# Patient Record
Sex: Female | Born: 1950 | Race: White | Hispanic: No | Marital: Single
Health system: Southern US, Community
[De-identification: ages and names within clinical notes are randomized; demographics above are authoritative.]

---

## 2021-03-16 ENCOUNTER — Emergency Department (HOSPITAL_COMMUNITY): Payer: Medicare Other

## 2021-03-16 ENCOUNTER — Emergency Department (HOSPITAL_COMMUNITY)
Admission: EM | Admit: 2021-03-16 | Discharge: 2021-03-16 | Discharge status: Against Medical Advice | Payer: Medicare Other | Attending: Emergency Medicine | Admitting: Emergency Medicine

## 2021-03-16 DIAGNOSIS — R109 Unspecified abdominal pain: Secondary | ICD-10-CM | POA: Diagnosis not present

## 2021-03-16 DIAGNOSIS — Z5321 Procedure and treatment not carried out due to patient leaving prior to being seen by health care provider: Secondary | ICD-10-CM | POA: Insufficient documentation

## 2021-03-16 DIAGNOSIS — R0781 Pleurodynia: Secondary | ICD-10-CM | POA: Insufficient documentation

## 2021-03-16 DIAGNOSIS — W010XXA Fall on same level from slipping, tripping and stumbling without subsequent striking against object, initial encounter: Secondary | ICD-10-CM | POA: Insufficient documentation

## 2021-03-16 LAB — COMPREHENSIVE METABOLIC PANEL
ALT: 17 U/L (ref 0–44)
AST: 24 U/L (ref 15–41)
Albumin: 4.1 g/dL (ref 3.5–5.0)
Alkaline Phosphatase: 70 U/L (ref 38–126)
Anion gap: 8 (ref 5–15)
BUN: 21 mg/dL (ref 8–23)
CO2: 25 mmol/L (ref 22–32)
Calcium: 9.4 mg/dL (ref 8.9–10.3)
Chloride: 108 mmol/L (ref 98–111)
Creatinine, Ser: 1.28 mg/dL — ABNORMAL HIGH (ref 0.44–1.00)
GFR, Estimated: 45 mL/min — ABNORMAL LOW (ref >60)
Glucose, Bld: 107 mg/dL — ABNORMAL HIGH (ref 70–99)
Potassium: 3.9 mmol/L (ref 3.5–5.1)
Sodium: 141 mmol/L (ref 135–145)
Total Bilirubin: 1 mg/dL (ref 0.3–1.2)
Total Protein: 6.8 g/dL (ref 6.5–8.1)

## 2021-03-16 LAB — CBC WITH DIFFERENTIAL/PLATELET
Abs Immature Granulocytes: 0.04 10*3/uL (ref 0.00–0.07)
Basophils Absolute: 0 10*3/uL (ref 0.0–0.1)
Basophils Relative: 1 %
Eosinophils Absolute: 0.1 10*3/uL (ref 0.0–0.5)
Eosinophils Relative: 2 %
HCT: 39.7 % (ref 36.0–46.0)
Hemoglobin: 12.9 g/dL (ref 12.0–15.0)
Immature Granulocytes: 1 %
Lymphocytes Relative: 16 %
Lymphs Abs: 1 10*3/uL (ref 0.7–4.0)
MCH: 30.2 pg (ref 26.0–34.0)
MCHC: 32.5 g/dL (ref 30.0–36.0)
MCV: 93 fL (ref 80.0–100.0)
Monocytes Absolute: 0.6 10*3/uL (ref 0.1–1.0)
Monocytes Relative: 10 %
Neutro Abs: 4.2 10*3/uL (ref 1.7–7.7)
Neutrophils Relative %: 70 %
Platelets: 262 10*3/uL (ref 150–400)
RBC: 4.27 MIL/uL (ref 3.87–5.11)
RDW: 13 % (ref 11.5–15.5)
WBC: 5.9 10*3/uL (ref 4.0–10.5)
nRBC: 0 % (ref 0.0–0.2)

## 2021-03-16 NOTE — ED Triage Notes (Addendum)
Patient reports she fell an hour ago and her left side hurts and left chest area. States that was not the side of impact. Patient landed on rear, says it jarred her. Pain is rated 6/10.

## 2021-03-16 NOTE — ED Notes (Signed)
Called x 3 with no answer.

## 2021-03-16 NOTE — ED Provider Notes (Signed)
Emergency Medicine Provider Triage Evaluation Note  Trezure Cronk , a 70 y.o. female  was evaluated in triage.  Pt complains of fall after tripping and falling into a ditch pta. She is c/o pain to the left ribs and left mid abdomen. She denies head trauma.  Review of Systems  Positive: Left rib pain, sob, abd pain Negative: Head injury  Physical Exam  BP (!) 159/109 (BP Location: Left Arm)   Pulse 99   Temp 99.1 F (37.3 C) (Oral)   Resp 18  Gen:   Awake, no distress   Resp:  Normal effort  MSK:   Moves extremities without difficulty  Other:  ttp to the left ribs, sternum and left abd/flank area, lung ctab  Medical Decision Making  Medically screening exam initiated at 3:03 PM.  Appropriate orders placed.  Lakeasha Petion was informed that the remainder of the evaluation will be completed by another provider, this initial triage assessment does not replace that evaluation, and the importance of remaining in the ED until their evaluation is complete.    Rayne Du 03/16/21 1506    Linwood Dibbles, MD 03/16/21 2155

## 2021-03-16 NOTE — ED Notes (Signed)
Patient called for room placement x1 without answer. 

## 2022-11-27 IMAGING — CR DG RIBS W/ CHEST 3+V*L*
3 series · 3 of 3 positions shown · non-contrast
Comparison: None.

CLINICAL DATA: Tripped and fell into a ditch. Complaining of
anterolateral left chest wall pain.

EXAM:
LEFT RIBS AND CHEST - 3+ VIEW

[w chest pa]
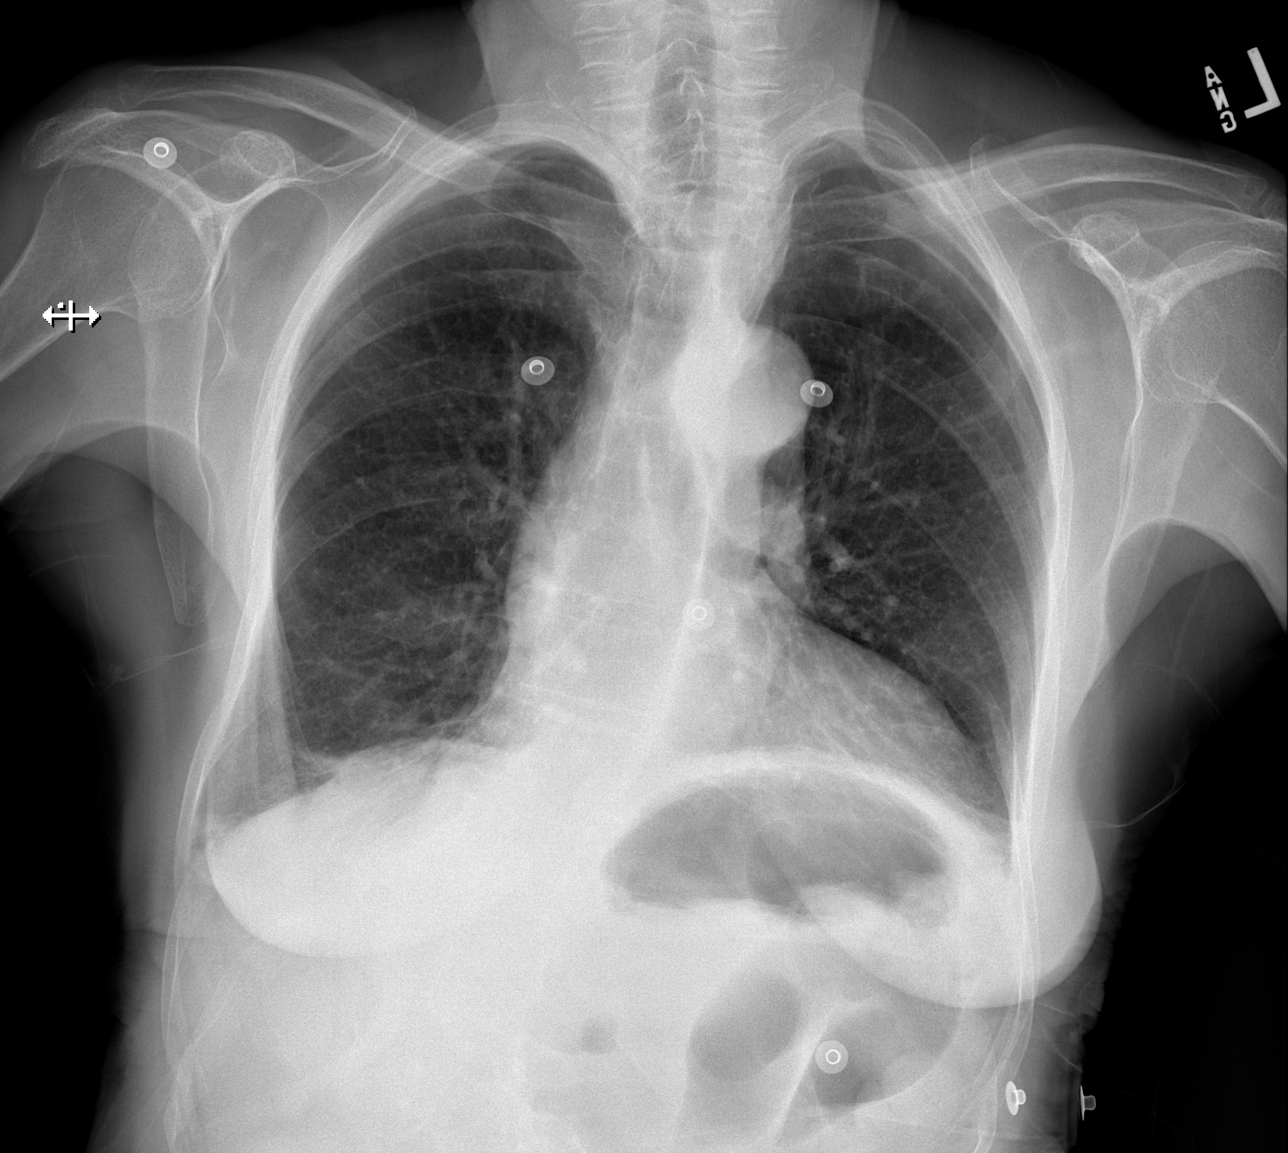

[w ribs ap upper left]
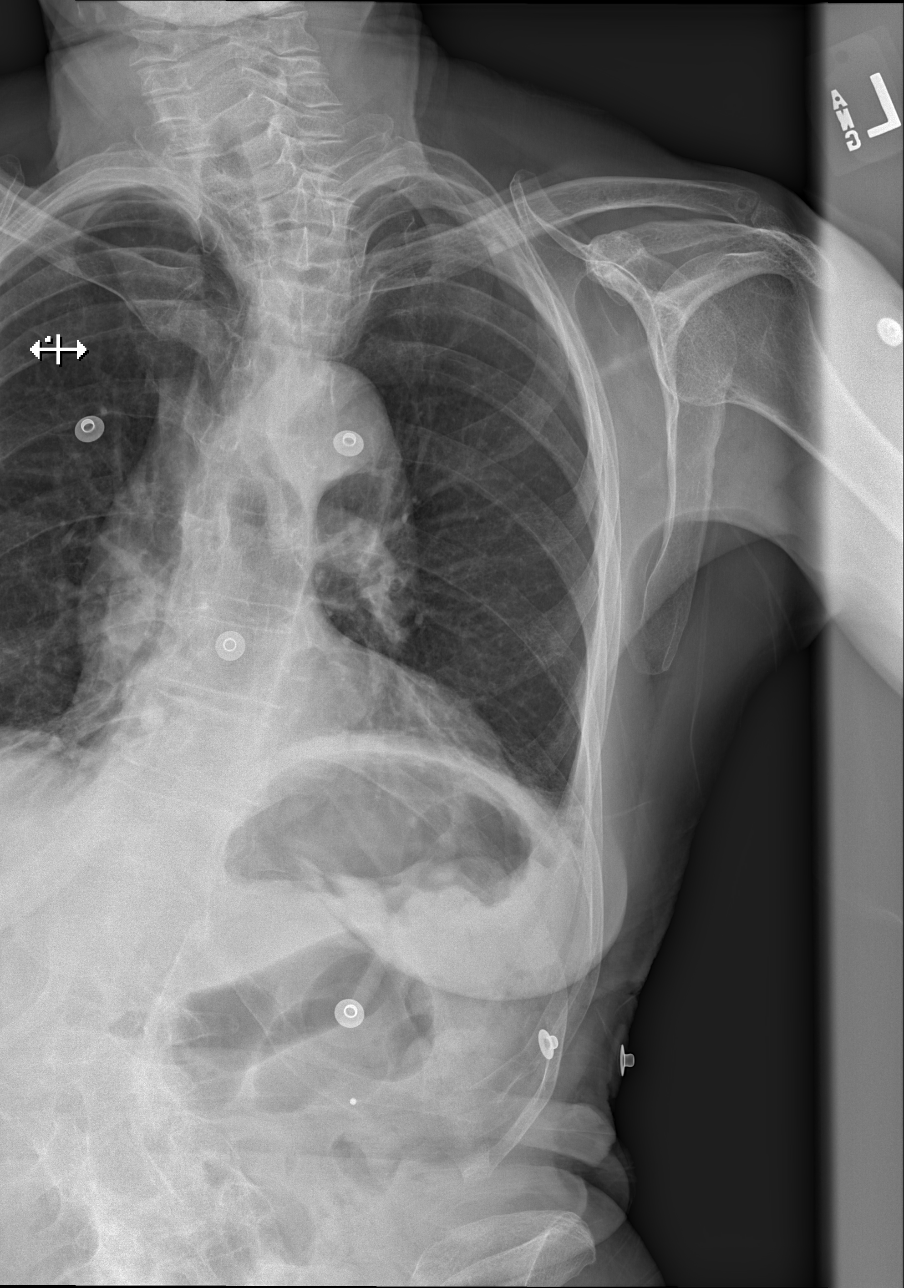

[w ribs obl left]
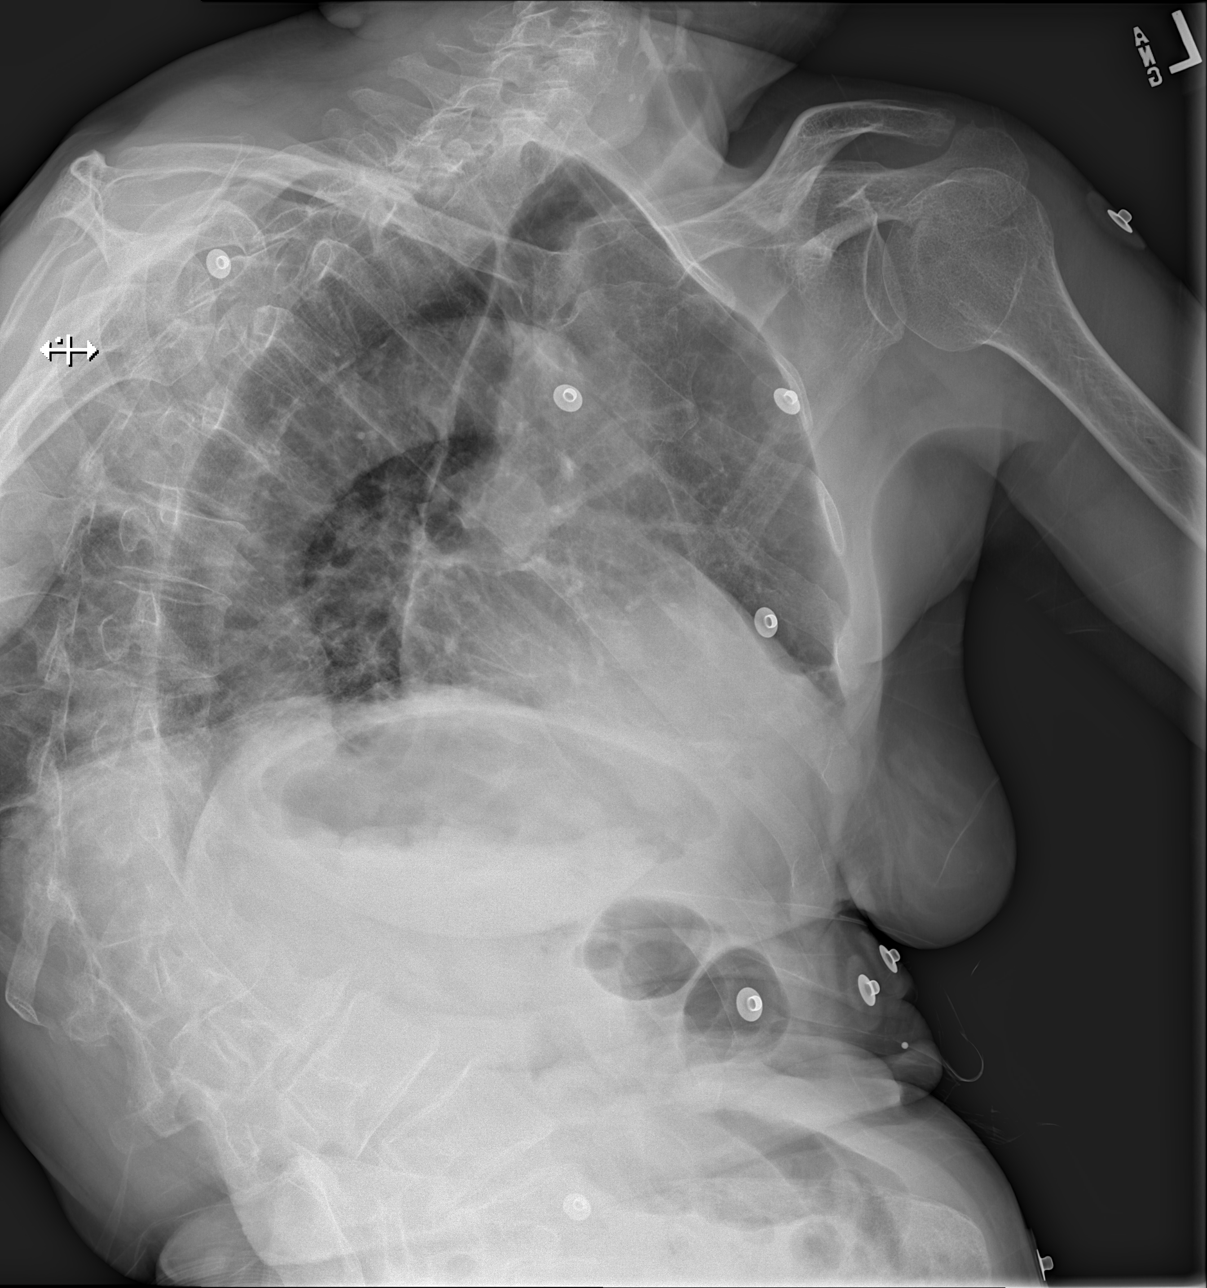

[3 of 3 positions shown; findings below may reference images not displayed]

FINDINGS: No convincing rib fracture. No rib lesion. Skeletal structures are
demineralized.

Linear opacities noted both lung bases consistent with atelectasis.
Remainder of the lungs is clear. No pleural effusion or
pneumothorax.

Cardiac silhouette normal in size.  No mediastinal or hilar masses.
IMPRESSION: 1. No rib fracture or rib lesion.
2. Mild lung base atelectasis.  No acute cardiopulmonary disease.
